# Patient Record
Sex: Male | Born: 1970 | Race: Black or African American | Hispanic: No | Marital: Married | Smoking: Never smoker
Health system: Southern US, Community
[De-identification: ages and names within clinical notes are randomized; demographics above are authoritative.]

## PROBLEM LIST (undated history)

## (undated) DIAGNOSIS — I10 Essential (primary) hypertension: Secondary | ICD-10-CM

---

## 2012-05-19 ENCOUNTER — Ambulatory Visit
Admission: RE | Admit: 2012-05-19 | Discharge: 2012-05-19 | Disposition: A | Discharge status: Home / Self Care | Payer: Managed Care, Other (non HMO) | Source: Ambulatory Visit | Attending: Family Medicine | Admitting: Family Medicine

## 2012-05-19 ENCOUNTER — Other Ambulatory Visit: Payer: Self-pay | Admitting: Family Medicine

## 2012-05-19 DIAGNOSIS — M25551 Pain in right hip: Secondary | ICD-10-CM

## 2013-10-14 IMAGING — CR DG HIP COMPLETE 2+V*R*
2 series · 2 of 2 positions shown · non-contrast
Comparison: None.

CLINICAL DATA: Right hip pain for 1 year, no injury

RIGHT HIP - COMPLETE 2+ VIEW

[t hip ap right]
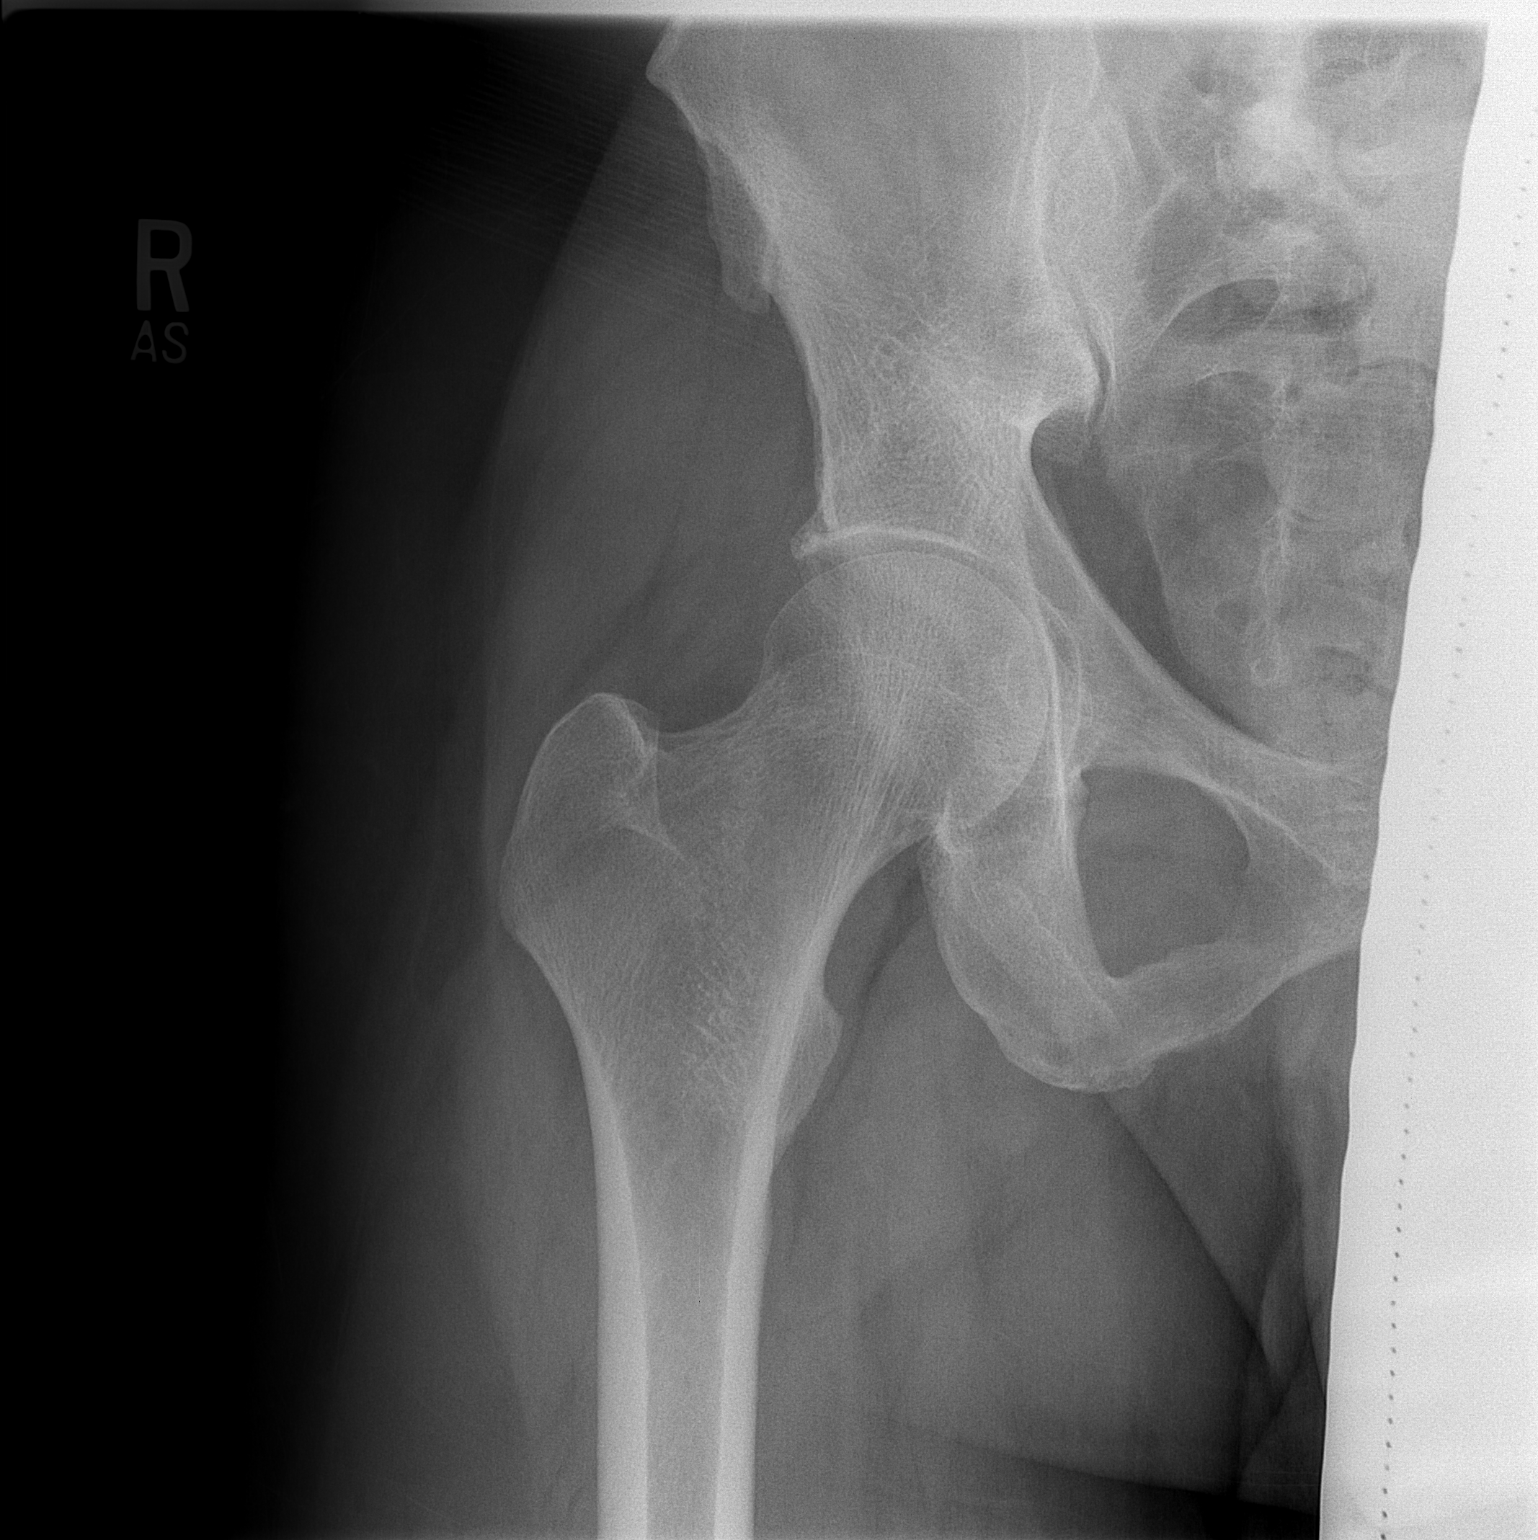

[t hip frog leg right]
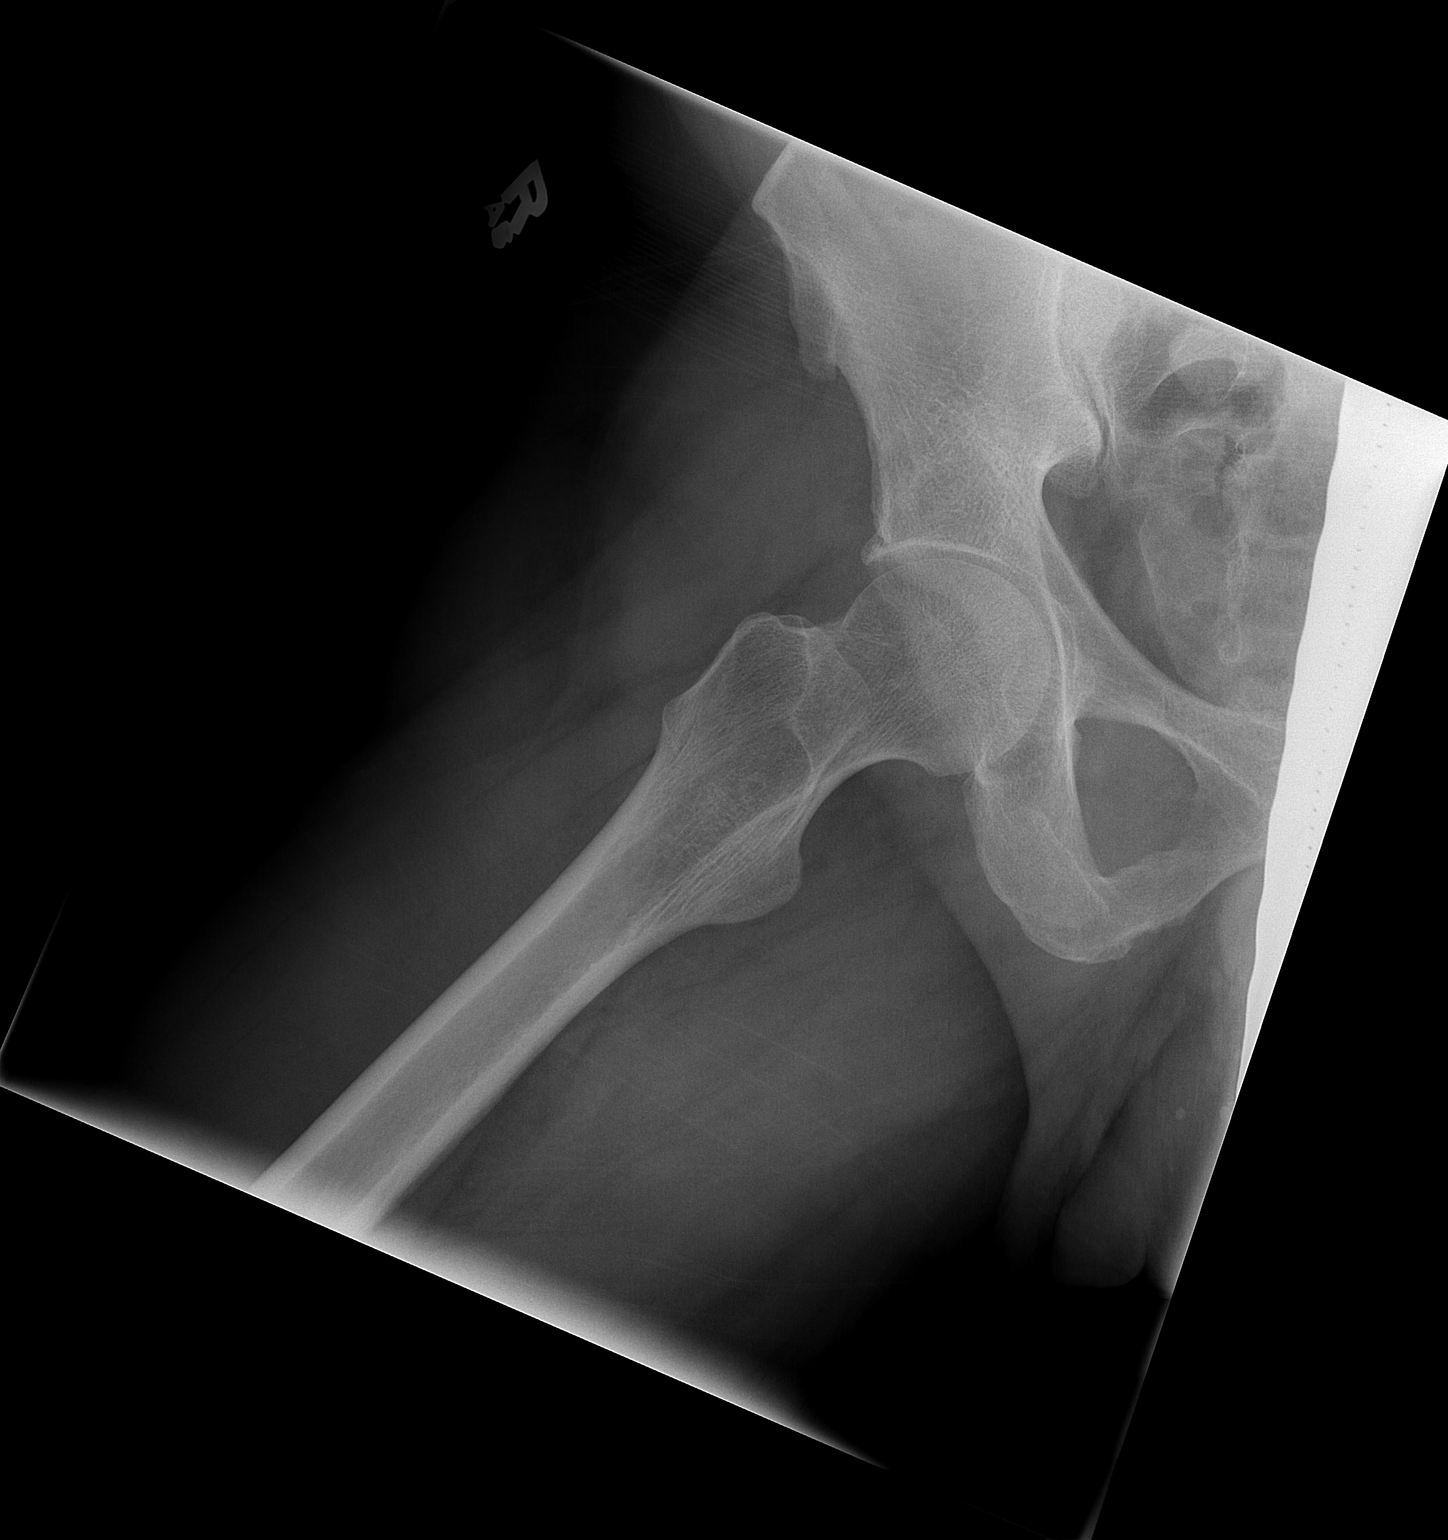

[2 of 2 positions shown; findings below may reference images not displayed]

FINDINGS: There is some loss of right hip joint space but no
significant spur formation or sclerosis is seen.  No acute bony
abnormality is noted.  The right ramus appears intact, and the
right SI joint is corticated.
IMPRESSION: Some loss of right hip joint space.  No significant degenerative
change however.

## 2021-03-23 ENCOUNTER — Encounter
Admission: EM | Disposition: A | Discharge status: Home / Self Care | Payer: Self-pay | Source: Home / Self Care | Attending: Emergency Medicine

## 2021-03-23 ENCOUNTER — Other Ambulatory Visit: Payer: Self-pay

## 2021-03-23 ENCOUNTER — Observation Stay
Admission: EM | Admit: 2021-03-23 | Discharge: 2021-03-24 | Disposition: A | Discharge status: Home / Self Care | Attending: Surgery | Admitting: Surgery

## 2021-03-23 ENCOUNTER — Emergency Department: Admitting: Certified Registered Nurse Anesthetist

## 2021-03-23 ENCOUNTER — Emergency Department

## 2021-03-23 ENCOUNTER — Encounter: Payer: Self-pay | Admitting: Emergency Medicine

## 2021-03-23 DIAGNOSIS — K8012 Calculus of gallbladder with acute and chronic cholecystitis without obstruction: Secondary | ICD-10-CM | POA: Diagnosis not present

## 2021-03-23 DIAGNOSIS — K819 Cholecystitis, unspecified: Secondary | ICD-10-CM | POA: Diagnosis present

## 2021-03-23 DIAGNOSIS — R109 Unspecified abdominal pain: Secondary | ICD-10-CM

## 2021-03-23 DIAGNOSIS — I10 Essential (primary) hypertension: Secondary | ICD-10-CM | POA: Insufficient documentation

## 2021-03-23 DIAGNOSIS — K810 Acute cholecystitis: Secondary | ICD-10-CM

## 2021-03-23 DIAGNOSIS — Z20822 Contact with and (suspected) exposure to covid-19: Secondary | ICD-10-CM | POA: Diagnosis not present

## 2021-03-23 DIAGNOSIS — R1011 Right upper quadrant pain: Secondary | ICD-10-CM | POA: Diagnosis present

## 2021-03-23 HISTORY — DX: Essential (primary) hypertension: I10

## 2021-03-23 LAB — COMPREHENSIVE METABOLIC PANEL
ALT: 38 U/L (ref 0–44)
AST: 28 U/L (ref 15–41)
Albumin: 4.6 g/dL (ref 3.5–5.0)
Alkaline Phosphatase: 59 U/L (ref 38–126)
Anion gap: 15 (ref 5–15)
BUN: 14 mg/dL (ref 6–20)
CO2: 24 mmol/L (ref 22–32)
Calcium: 9.4 mg/dL (ref 8.9–10.3)
Chloride: 100 mmol/L (ref 98–111)
Creatinine, Ser: 1.04 mg/dL (ref 0.61–1.24)
GFR, Estimated: >60 mL/min (ref >60)
Glucose, Bld: 126 mg/dL — ABNORMAL HIGH (ref 70–99)
Potassium: 4.6 mmol/L (ref 3.5–5.1)
Sodium: 139 mmol/L (ref 135–145)
Total Bilirubin: 1 mg/dL (ref 0.3–1.2)
Total Protein: 8.1 g/dL (ref 6.5–8.1)

## 2021-03-23 LAB — CBC
HCT: 48.3 % (ref 39.0–52.0)
Hemoglobin: 15.9 g/dL (ref 13.0–17.0)
MCH: 28.2 pg (ref 26.0–34.0)
MCHC: 32.9 g/dL (ref 30.0–36.0)
MCV: 85.8 fL (ref 80.0–100.0)
Platelets: 341 10*3/uL (ref 150–400)
RBC: 5.63 MIL/uL (ref 4.22–5.81)
RDW: 12.2 % (ref 11.5–15.5)
WBC: 11.7 10*3/uL — ABNORMAL HIGH (ref 4.0–10.5)
nRBC: 0 % (ref 0.0–0.2)

## 2021-03-23 LAB — LIPASE, BLOOD: Lipase: 28 U/L (ref 11–51)

## 2021-03-23 LAB — RESP PANEL BY RT-PCR (FLU A&B, COVID) ARPGX2
Influenza A by PCR: NEGATIVE
Influenza B by PCR: NEGATIVE
SARS Coronavirus 2 by RT PCR: NEGATIVE

## 2021-03-23 SURGERY — CHOLECYSTECTOMY, ROBOT-ASSISTED, LAPAROSCOPIC
Anesthesia: General

## 2021-03-23 MED ORDER — FENTANYL CITRATE (PF) 100 MCG/2ML IJ SOLN
INTRAMUSCULAR | Status: AC
  Filled 2021-03-23: qty 2

## 2021-03-23 MED ORDER — ONDANSETRON HCL 4 MG/2ML IJ SOLN
4.0000 mg | INTRAMUSCULAR | Status: AC
  Administered 2021-03-23: 4 mg via INTRAVENOUS
  Filled 2021-03-23: qty 2

## 2021-03-23 MED ORDER — FENTANYL CITRATE (PF) 100 MCG/2ML IJ SOLN: 25.0000 ug | INTRAMUSCULAR | Status: DC | PRN

## 2021-03-23 MED ORDER — PIPERACILLIN-TAZOBACTAM 3.375 G IVPB: 3.3750 g | Freq: Three times a day (TID) | INTRAVENOUS | Status: DC

## 2021-03-23 MED ORDER — DIPHENHYDRAMINE HCL 12.5 MG/5ML PO ELIX
12.5000 mg | ORAL_SOLUTION | Freq: Four times a day (QID) | ORAL | Status: DC | PRN
  Filled 2021-03-23: qty 5

## 2021-03-23 MED ORDER — MORPHINE SULFATE (PF) 2 MG/ML IV SOLN
2.0000 mg | Freq: Once | INTRAVENOUS | Status: AC
  Administered 2021-03-23: 2 mg via INTRAVENOUS
  Filled 2021-03-23: qty 1

## 2021-03-23 MED ORDER — MIDAZOLAM HCL 2 MG/2ML IJ SOLN
INTRAMUSCULAR | Status: AC
  Filled 2021-03-23: qty 2

## 2021-03-23 MED ORDER — LIDOCAINE HCL (PF) 2 % IJ SOLN
INTRAMUSCULAR | Status: AC
  Filled 2021-03-23: qty 5

## 2021-03-23 MED ORDER — DEXAMETHASONE SODIUM PHOSPHATE 10 MG/ML IJ SOLN
INTRAMUSCULAR | Status: DC | PRN
  Administered 2021-03-23: 10 mg via INTRAVENOUS

## 2021-03-23 MED ORDER — MORPHINE SULFATE (PF) 4 MG/ML IV SOLN
4.0000 mg | Freq: Once | INTRAVENOUS | Status: AC
  Administered 2021-03-23: 4 mg via INTRAVENOUS
  Filled 2021-03-23: qty 1

## 2021-03-23 MED ORDER — SUGAMMADEX SODIUM 200 MG/2ML IV SOLN
INTRAVENOUS | Status: DC | PRN
  Administered 2021-03-23: 200 mg via INTRAVENOUS

## 2021-03-23 MED ORDER — ACETAMINOPHEN 500 MG PO TABS
1000.0000 mg | ORAL_TABLET | Freq: Four times a day (QID) | ORAL | Status: DC
  Administered 2021-03-23 – 2021-03-24 (×2): 1000 mg via ORAL
  Filled 2021-03-23 (×2): qty 2

## 2021-03-23 MED ORDER — SODIUM CHLORIDE 0.9 % IV SOLN: INTRAVENOUS | Status: DC

## 2021-03-23 MED ORDER — ROCURONIUM BROMIDE 100 MG/10ML IV SOLN
INTRAVENOUS | Status: DC | PRN
  Administered 2021-03-23: 20 mg via INTRAVENOUS
  Administered 2021-03-23: 50 mg via INTRAVENOUS
  Administered 2021-03-23: 20 mg via INTRAVENOUS

## 2021-03-23 MED ORDER — DEXMEDETOMIDINE (PRECEDEX) IN NS 20 MCG/5ML (4 MCG/ML) IV SYRINGE
PREFILLED_SYRINGE | INTRAVENOUS | Status: DC | PRN
  Administered 2021-03-23: 4 ug via INTRAVENOUS
  Administered 2021-03-23: 8 ug via INTRAVENOUS
  Administered 2021-03-23 (×2): 4 ug via INTRAVENOUS

## 2021-03-23 MED ORDER — DEXAMETHASONE SODIUM PHOSPHATE 10 MG/ML IJ SOLN
INTRAMUSCULAR | Status: AC
  Filled 2021-03-23: qty 1

## 2021-03-23 MED ORDER — ONDANSETRON HCL 4 MG/2ML IJ SOLN
INTRAMUSCULAR | Status: DC | PRN
Start: 2021-03-23 — End: 2021-03-23
  Administered 2021-03-23: 4 mg via INTRAVENOUS

## 2021-03-23 MED ORDER — PHENYLEPHRINE 40 MCG/ML (10ML) SYRINGE FOR IV PUSH (FOR BLOOD PRESSURE SUPPORT)
PREFILLED_SYRINGE | INTRAVENOUS | Status: DC | PRN
  Administered 2021-03-23 (×2): 160 ug via INTRAVENOUS
  Administered 2021-03-23 (×4): 80 ug via INTRAVENOUS

## 2021-03-23 MED ORDER — ONDANSETRON 4 MG PO TBDP: 4.0000 mg | ORAL_TABLET | Freq: Four times a day (QID) | ORAL | Status: DC | PRN

## 2021-03-23 MED ORDER — GLYCOPYRROLATE 0.2 MG/ML IJ SOLN
INTRAMUSCULAR | Status: DC | PRN
  Administered 2021-03-23 (×2): .2 mg via INTRAVENOUS

## 2021-03-23 MED ORDER — OXYCODONE HCL 5 MG PO TABS: 5.0000 mg | ORAL_TABLET | ORAL | Status: DC | PRN

## 2021-03-23 MED ORDER — ONDANSETRON HCL 4 MG/2ML IJ SOLN: 4.0000 mg | Freq: Four times a day (QID) | INTRAMUSCULAR | Status: DC | PRN

## 2021-03-23 MED ORDER — ENOXAPARIN SODIUM 60 MG/0.6ML IJ SOSY
0.5000 mg/kg | PREFILLED_SYRINGE | INTRAMUSCULAR | Status: DC
  Administered 2021-03-24: 50 mg via SUBCUTANEOUS
  Filled 2021-03-23: qty 0.6

## 2021-03-23 MED ORDER — PHENYLEPHRINE HCL-NACL 20-0.9 MG/250ML-% IV SOLN
INTRAVENOUS | Status: DC | PRN
  Administered 2021-03-23: 20 ug/min via INTRAVENOUS

## 2021-03-23 MED ORDER — SODIUM CHLORIDE 0.9 % IV BOLUS
1000.0000 mL | Freq: Once | INTRAVENOUS | Status: AC
  Administered 2021-03-23: 1000 mL via INTRAVENOUS

## 2021-03-23 MED ORDER — PIPERACILLIN-TAZOBACTAM 3.375 G IVPB
3.3750 g | Freq: Three times a day (TID) | INTRAVENOUS | Status: DC
  Administered 2021-03-23 – 2021-03-24 (×3): 3.375 g via INTRAVENOUS
  Filled 2021-03-23 (×2): qty 50

## 2021-03-23 MED ORDER — INDOCYANINE GREEN 25 MG IV SOLR
5.0000 mg | Freq: Once | INTRAVENOUS | Status: AC
  Administered 2021-03-23: 5 mg via INTRAVENOUS
  Filled 2021-03-23: qty 2

## 2021-03-23 MED ORDER — DIPHENHYDRAMINE HCL 50 MG/ML IJ SOLN: 12.5000 mg | Freq: Four times a day (QID) | INTRAMUSCULAR | Status: DC | PRN

## 2021-03-23 MED ORDER — STERILE WATER FOR IRRIGATION IR SOLN
Status: DC | PRN
  Administered 2021-03-23: 2000 mL

## 2021-03-23 MED ORDER — FENTANYL CITRATE (PF) 100 MCG/2ML IJ SOLN
INTRAMUSCULAR | Status: DC | PRN
Start: 2021-03-23 — End: 2021-03-23
  Administered 2021-03-23 (×4): 50 ug via INTRAVENOUS

## 2021-03-23 MED ORDER — BUPIVACAINE-EPINEPHRINE (PF) 0.25% -1:200000 IJ SOLN
INTRAMUSCULAR | Status: DC | PRN
  Administered 2021-03-23: 30 mL via PERINEURAL

## 2021-03-23 MED ORDER — LIDOCAINE HCL (CARDIAC) PF 100 MG/5ML IV SOSY
PREFILLED_SYRINGE | INTRAVENOUS | Status: DC | PRN
  Administered 2021-03-23: 100 mg via INTRAVENOUS

## 2021-03-23 MED ORDER — MIDAZOLAM HCL 2 MG/2ML IJ SOLN
INTRAMUSCULAR | Status: DC | PRN
  Administered 2021-03-23: 2 mg via INTRAVENOUS

## 2021-03-23 MED ORDER — PROPOFOL 10 MG/ML IV BOLUS
INTRAVENOUS | Status: AC
  Filled 2021-03-23: qty 20

## 2021-03-23 MED ORDER — ACETAMINOPHEN 10 MG/ML IV SOLN
INTRAVENOUS | Status: DC | PRN
  Administered 2021-03-23: 1000 mg via INTRAVENOUS

## 2021-03-23 MED ORDER — KETOROLAC TROMETHAMINE 30 MG/ML IJ SOLN
30.0000 mg | Freq: Four times a day (QID) | INTRAMUSCULAR | Status: DC
  Administered 2021-03-23 – 2021-03-24 (×2): 30 mg via INTRAVENOUS
  Filled 2021-03-23 (×2): qty 1

## 2021-03-23 MED ORDER — PANTOPRAZOLE SODIUM 40 MG IV SOLR
40.0000 mg | Freq: Every day | INTRAVENOUS | Status: DC
  Administered 2021-03-23: 40 mg via INTRAVENOUS
  Filled 2021-03-23: qty 40

## 2021-03-23 MED ORDER — LACTATED RINGERS IV SOLN: INTRAVENOUS | Status: DC | PRN

## 2021-03-23 MED ORDER — KETOROLAC TROMETHAMINE 30 MG/ML IJ SOLN: 30.0000 mg | Freq: Four times a day (QID) | INTRAMUSCULAR | Status: DC | PRN

## 2021-03-23 MED ORDER — ONDANSETRON HCL 4 MG/2ML IJ SOLN
INTRAMUSCULAR | Status: AC
  Filled 2021-03-23: qty 2

## 2021-03-23 MED ORDER — KETOROLAC TROMETHAMINE 30 MG/ML IJ SOLN
INTRAMUSCULAR | Status: AC
  Filled 2021-03-23: qty 1

## 2021-03-23 MED ORDER — PROMETHAZINE HCL 25 MG/ML IJ SOLN: 6.2500 mg | INTRAMUSCULAR | Status: DC | PRN

## 2021-03-23 MED ORDER — BUPIVACAINE LIPOSOME 1.3 % IJ SUSP
INTRAMUSCULAR | Status: DC | PRN
  Administered 2021-03-23: 20 mL

## 2021-03-23 MED ORDER — PROPOFOL 10 MG/ML IV BOLUS
INTRAVENOUS | Status: DC | PRN
  Administered 2021-03-23: 200 mg via INTRAVENOUS

## 2021-03-23 MED ORDER — KETOROLAC TROMETHAMINE 30 MG/ML IJ SOLN
INTRAMUSCULAR | Status: DC | PRN
Start: 2021-03-23 — End: 2021-03-23
  Administered 2021-03-23: 30 mg via INTRAVENOUS

## 2021-03-23 MED ORDER — BUPROPION HCL ER (XL) 150 MG PO TB24
150.0000 mg | ORAL_TABLET | Freq: Every day | ORAL | Status: DC
Start: 2021-03-23 — End: 2021-03-24
  Administered 2021-03-23 – 2021-03-24 (×2): 150 mg via ORAL
  Filled 2021-03-23 (×2): qty 1

## 2021-03-23 MED ORDER — ROCURONIUM BROMIDE 10 MG/ML (PF) SYRINGE
PREFILLED_SYRINGE | INTRAVENOUS | Status: AC
  Filled 2021-03-23: qty 10

## 2021-03-23 MED ORDER — MORPHINE SULFATE (PF) 2 MG/ML IV SOLN: 2.0000 mg | INTRAVENOUS | Status: DC | PRN

## 2021-03-23 MED ORDER — ACETAMINOPHEN 10 MG/ML IV SOLN
INTRAVENOUS | Status: AC
  Filled 2021-03-23: qty 100

## 2021-03-23 SURGICAL SUPPLY — 52 items
BULB RESERV EVAC DRAIN JP 100C (MISCELLANEOUS) ×1 IMPLANT
CANNULA REDUC XI 12-8 STAPL (CANNULA) ×1
CANNULA REDUCER 12-8 DVNC XI (CANNULA) ×2 IMPLANT
CLIP LIGATING HEM O LOK PURPLE (MISCELLANEOUS) ×1 IMPLANT
CLIP LIGATING HEMO O LOK GREEN (MISCELLANEOUS) ×3 IMPLANT
DECANTER SPIKE VIAL GLASS SM (MISCELLANEOUS) ×3 IMPLANT
DERMABOND ADVANCED (GAUZE/BANDAGES/DRESSINGS) ×1
DERMABOND ADVANCED .7 DNX12 (GAUZE/BANDAGES/DRESSINGS) ×2 IMPLANT
DRAIN CHANNEL JP 19F (MISCELLANEOUS) ×1 IMPLANT
DRAPE ARM DVNC X/XI (DISPOSABLE) ×8 IMPLANT
DRAPE COLUMN DVNC XI (DISPOSABLE) ×2 IMPLANT
DRAPE DA VINCI XI ARM (DISPOSABLE) ×4
DRAPE DA VINCI XI COLUMN (DISPOSABLE) ×1
ELECT CAUTERY BLADE 6.4 (BLADE) ×3 IMPLANT
ELECT REM PT RETURN 9FT ADLT (ELECTROSURGICAL) ×3
ELECTRODE REM PT RTRN 9FT ADLT (ELECTROSURGICAL) ×2 IMPLANT
GLOVE SURG ENC MOIS LTX SZ7 (GLOVE) ×6 IMPLANT
GOWN STRL REUS W/ TWL LRG LVL3 (GOWN DISPOSABLE) ×8 IMPLANT
GOWN STRL REUS W/TWL LRG LVL3 (GOWN DISPOSABLE) ×4
IRRIGATION STRYKERFLOW (MISCELLANEOUS) IMPLANT
IRRIGATOR STRYKERFLOW (MISCELLANEOUS) ×3
IV NS 1000ML (IV SOLUTION)
IV NS 1000ML BAXH (IV SOLUTION) IMPLANT
KIT PINK PAD W/HEAD ARE REST (MISCELLANEOUS) ×3 IMPLANT
KIT PINK PAD W/HEAD ARM REST (MISCELLANEOUS) ×2 IMPLANT
LABEL OR SOLS (LABEL) ×3 IMPLANT
MANIFOLD NEPTUNE II (INSTRUMENTS) ×3 IMPLANT
NEEDLE HYPO 22GX1.5 SAFETY (NEEDLE) ×3 IMPLANT
NS IRRIG 500ML POUR BTL (IV SOLUTION) ×3 IMPLANT
OBTURATOR OPTICAL STANDARD 8MM (TROCAR) ×1
OBTURATOR OPTICAL STND 8 DVNC (TROCAR) ×2
OBTURATOR OPTICALSTD 8 DVNC (TROCAR) ×2 IMPLANT
PACK LAP CHOLECYSTECTOMY (MISCELLANEOUS) ×3 IMPLANT
PENCIL ELECTRO HAND CTR (MISCELLANEOUS) ×3 IMPLANT
POUCH SPECIMEN RETRIEVAL 10MM (ENDOMECHANICALS) ×3 IMPLANT
SEAL CANN UNIV 5-8 DVNC XI (MISCELLANEOUS) ×6 IMPLANT
SEAL XI 5MM-8MM UNIVERSAL (MISCELLANEOUS) ×3
SET TUBE SMOKE EVAC HIGH FLOW (TUBING) ×3 IMPLANT
SOLUTION ELECTROLUBE (MISCELLANEOUS) ×3 IMPLANT
SPONGE T-LAP 18X18 ~~LOC~~+RFID (SPONGE) ×3 IMPLANT
SPONGE T-LAP 4X18 ~~LOC~~+RFID (SPONGE) IMPLANT
STAPLER CANNULA SEAL DVNC XI (STAPLE) ×2 IMPLANT
STAPLER CANNULA SEAL XI (STAPLE) ×1
SUT ETHILON 3 0 PS 1 (SUTURE) ×1 IMPLANT
SUT MNCRL AB 4-0 PS2 18 (SUTURE) ×3 IMPLANT
SUT VICRYL 0 AB UR-6 (SUTURE) ×6 IMPLANT
SYR 20ML LL LF (SYRINGE) ×3 IMPLANT
SYR 30ML LL (SYRINGE) ×3 IMPLANT
TAPE TRANSPORE STRL 2 31045 (GAUZE/BANDAGES/DRESSINGS) ×3 IMPLANT
TROCAR BALLN GELPORT 12X130M (ENDOMECHANICALS) ×3 IMPLANT
WATER STERILE IRR 3000ML UROMA (IV SOLUTION) ×1 IMPLANT
WATER STERILE IRR 500ML POUR (IV SOLUTION) ×2 IMPLANT

## 2021-03-23 NOTE — Anesthesia Procedure Notes (Signed)
Procedure Name: Intubation Date/Time: 03/23/2021 4:53 PM Performed by: Lily Peer, Cristabel Bicknell, CRNA Pre-anesthesia Checklist: Patient identified, Emergency Drugs available, Suction available and Patient being monitored Patient Re-evaluated:Patient Re-evaluated prior to induction Oxygen Delivery Method: Circle system utilized Preoxygenation: Pre-oxygenation with 100% oxygen Induction Type: IV induction Ventilation: Mask ventilation without difficulty Laryngoscope Size: McGraph and 4 Grade View: Grade I Tube type: Oral Tube size: 7.5 mm Number of attempts: 1 Airway Equipment and Method: Stylet Placement Confirmation: ETT inserted through vocal cords under direct vision, positive ETCO2 and breath sounds checked- equal and bilateral Secured at: 23 cm Tube secured with: Tape Dental Injury: Teeth and Oropharynx as per pre-operative assessment

## 2021-03-23 NOTE — Transfer of Care (Signed)
Immediate Anesthesia Transfer of Care Note  Patient: Eugene Trevino  Procedure(s) Performed: XI ROBOTIC ASSISTED LAPAROSCOPIC CHOLECYSTECTOMY INDOCYANINE GREEN FLUORESCENCE IMAGING (ICG)  Patient Location: PACU  Anesthesia Type:General  Level of Consciousness: drowsy  Airway & Oxygen Therapy: Patient Spontanous Breathing, Patient connected to face mask oxygen and Patient connected to face mask  Post-op Assessment: Report given to RN  Post vital signs: stable  Last Vitals:  Vitals Value Taken Time  BP    Temp    Pulse 74 03/23/21 1851  Resp    SpO2 100 % 03/23/21 1851  Vitals shown include unvalidated device data.  Last Pain:  Vitals:   03/23/21 1156  TempSrc:   PainSc: 2          Complications: No notable events documented.

## 2021-03-23 NOTE — ED Provider Notes (Signed)
[facility] Provider Note    Event Date/Time   First MD Initiated Contact with Patient 03/23/21 [phone]     (approximate)   History   Abdominal Pain, Nausea, and Diarrhea   HPI  Eugene Trevino is a 51 y.o. male with no reported past medical history except noted in the [place] system hypertension and PTSD  [place] to 3 days ago developed upper abdominal pain.  Has been crampy severe seemingly worsening with frequent vomiting of food and gastric contents.  Denies any black or bloody emesis.  He has had no accompanying diarrhea and does not think he had a bowel movement for few days either.  No previous surgical history  Primarily follows at the [facility]  Reviewed patient's previous office visit with Dr. Brandon Melnick on December 31, 2020 noted to have a history of hypertension depression  No associated chest pain or trouble breathing.  No fevers.  No pain or burning with urination.  Normal urination last urinated last night   Does not use alcohol or smoke  Physical Exam   Triage Vital Signs: ED Triage Vitals  Enc Vitals Group     BP --      Pulse --      Resp --      Temp --      Temp src --      SpO2 --      Weight 03/23/21 0840 220 lb (99.8 kg)     Height 03/23/21 0840 5\' 8"  (1.727 m)     Head Circumference --      Peak Flow --      Pain Score 03/23/21 0839 10     Pain Loc --      Pain Edu? --      Excl. in [place]? --     Most recent vital signs: Vitals:   03/23/21 1300 03/23/21 1430  BP: (!) 170/86 (!) 154/88  Pulse: (!) 51 (!) 53  Resp: 18 20  Temp:    SpO2: 97% 98%     General: Awake, no distress.  Does appear to be in pain.  Does not wish to move seems that movement worsens his abdominal pain.  Sitting quite still and does appear to be in at least moderate pain slightly wincing CV:  Good peripheral perfusion.  Normal heart tones no tachycardia. Resp:  Normal effort.  Normal respirations normal and clear lung sounds with clear  speech Abd:  No distention.  He has focal tenderness mostly in the epigastric and right upper quadrant with positive Murphy sign.  Does exhibit some rebound tenderness in this region as well.  Denies pain over the left upper quadrant and bilateral lower quadrants bilateral.  Slightly distended.  No hernias noted. Other:  No rash, except he has a slight darkening of his skin in his flanks bilaterally which he reports is chronic and is a rash that developed while serving in the [place] and has been seen by previous physicians and diagnosed with form of eczema because of skin darkening   ED Results / Procedures / Treatments   Labs (all labs ordered are listed, but only abnormal results are displayed) Labs Reviewed  CBC - Abnormal; Notable for the following components:      Result Value   WBC 11.7 (*)    All other components within normal limits  COMPREHENSIVE METABOLIC PANEL - Abnormal; Notable for the following components:   Glucose, Bld 126 (*)    All other  components within normal limits  RESP PANEL BY RT-PCR (FLU A&B, COVID) ARPGX2  LIPASE, BLOOD     EKG  ED ECG REPORT I, Delman Kitten, the attending physician, personally viewed and interpreted this ECG.  Date: 03/23/2021 EKG Time: 920 Rate: 60 Rhythm: normal sinus rhythm QRS Axis: normal Intervals: normal ST/T Wave abnormalities: normal Narrative Interpretation: no evidence of acute ischemia    RADIOLOGY US ABDOMEN LIMITED RUQ (LIVER/GB)  Result Date: 03/23/2021 CLINICAL DATA:  Abdominal pain EXAM: ULTRASOUND ABDOMEN LIMITED RIGHT UPPER QUADRANT COMPARISON:  None. FINDINGS: Gallbladder: Numerous echogenic shadowing calculi in the gallbladder. Wall thickening measuring 5 mm with mild increased color Doppler vascularity in the wall. Trace pericholecystic fluid. Negative sonographic Murphy's sign. Common bile duct: Diameter: 6 mm Liver: Diffuse coarse, increased echogenicity of the parenchyma with no focal mass identified.  Portal vein is patent on color Doppler imaging with normal direction of blood flow towards the liver. Other: None. IMPRESSION: 1. Cholelithiasis with mild wall thickening, trace pericholecystic fluid and mildly increased vascularity in the wall. Negative sonographic Murphy's sign reported. Correlate clinically. 2. Abnormal appearance of the liver parenchyma suggesting hepatic steatosis and/or other hepatocellular disease. Electronically Signed   By: Ofilia Neas M.D.   On: 03/23/2021 10:17         PROCEDURES:  Critical Care performed: No  Procedures   MEDICATIONS ORDERED IN ED: Medications  piperacillin-tazobactam (ZOSYN) IVPB 3.375 g (has no administration in time range)  indocyanine green (IC-GREEN) injection 5 mg (has no administration in time range)  sodium chloride 0.9 % bolus 1,000 mL (1,000 mLs Intravenous New Bag/Given 03/23/21 0931)  morphine 4 MG/ML injection 4 mg (4 mg Intravenous Given 03/23/21 0931)  ondansetron (ZOFRAN) injection 4 mg (4 mg Intravenous Given 03/23/21 0931)  morphine 4 MG/ML injection 4 mg (4 mg Intravenous Given 03/23/21 1105)  morphine 2 MG/ML injection 2 mg (2 mg Intravenous Given 03/23/21 1222)     IMPRESSION / MDM / ASSESSMENT AND PLAN / ED COURSE  I reviewed the triage vital signs and the nursing notes.                              Differential diagnosis includes but is not limited to, abdominal perforation, aortic dissection, cholecystitis, appendicitis, diverticulitis, colitis, esophagitis/gastritis, kidney stone, pyelonephritis, urinary tract infection, aortic aneurysm. All are considered in decision and treatment plan. Based upon the patient's presentation and risk factors, we will proceed by obtaining ultrasound right upper quadrant as well as pain control and obtaining laboratory testing.  He does not have any acute cardiopulmonary symptoms.  He has a normal vascular examination of his distal extremities, and also I performed a bedside limited  ultrasound of the aorta for which I do not see any evidence of aneurysmal dilatation or dissection flap.  We will proceed by obtaining right upper quadrant pain control.  Thereafter if right upper quadrant unrevealing may proceed with CT imaging to further evaluate.  He does appear to have evidence of a notable abdominal pain that seems to be mostly centered in the epigastric and right upper quadrant region at this time.  Plan of care discussed with patient and his wife both understanding agreeable with plan and treatment recommendations    ----------------------------------------- 3:40 PM on 03/23/2021 ----------------------------------------- Patient has been seen by general surgery Dr. Dahlia Byes who is admitting to surgical service.   Personally reviewed the patient's imaging and note concern for gallbladder wall edema.  Mild  leukocytosis on my review and interpretation of patient's laboratory testing and also negative COVID test.  Clinical Course as of 03/23/21 1542  Sun Mar 23, 2021  1057 I have placed a consultation with Dr. Dahlia Byes in general surgery.  Updated patient he does report his pain is persistent does still have persistent pain on exam focally in the right upper quadrant epigastric region.  Additional morphine ordered he is fully awake and alert.  Patient is wife understanding of the plan to proceed with a general surgery consultation at this time [MQ]  21 CHL denoes Dr. Dahlia Byes in [place]. Haiku message requesting consult sent (in addition to page), but plan to allow time for Dr. Dahlia Byes to call back given in [place] [MQ]    Clinical Course User Index [MQ] Delman Kitten, MD     FINAL CLINICAL IMPRESSION(S) / ED DIAGNOSES   Final diagnoses:  Abdominal pain  Suspected acute cholecystitis   Rx / DC Orders   ED Discharge Orders     None        Note:  This document was prepared using Dragon voice recognition software and may include unintentional dictation errors.   Delman Kitten,  MD 03/23/21 [phone]

## 2021-03-23 NOTE — Anesthesia Preprocedure Evaluation (Signed)
Anesthesia Evaluation  Patient identified by MRN, date of birth, ID band Patient awake    Reviewed: Allergy & Precautions, H&P , NPO status , Patient's Chart, lab work & pertinent test results, reviewed documented beta blocker date and time   History of Anesthesia Complications Negative for: history of anesthetic complications  Airway Mallampati: I  TM Distance: >3 FB Neck ROM: full    Dental  (+) Caps, Dental Advidsory Given, Missing, Teeth Intact   Pulmonary neg pulmonary ROS,    Pulmonary exam normal breath sounds clear to auscultation       Cardiovascular Exercise Tolerance: Good hypertension, (-) angina(-) Past MI and (-) Cardiac Stents Normal cardiovascular exam(-) dysrhythmias + Valvular Problems/Murmurs  Rhythm:regular Rate:Normal     Neuro/Psych negative neurological ROS  negative psych ROS   GI/Hepatic Neg liver ROS, GERD  ,  Endo/Other  diabetes (borderline)  Renal/GU negative Renal ROS  negative genitourinary   Musculoskeletal   Abdominal   Peds  Hematology negative hematology ROS (+)   Anesthesia Other Findings Past Medical History: No date: Hypertension   Reproductive/Obstetrics negative OB ROS                             Anesthesia Physical Anesthesia Plan  ASA: 2  Anesthesia Plan: General   Post-op Pain Management:    Induction: Intravenous, Rapid sequence and Cricoid pressure planned  PONV Risk Score and Plan: 2 and Ondansetron, Dexamethasone, Midazolam, Treatment may vary due to age or medical condition and Promethazine  Airway Management Planned: Oral ETT  Additional Equipment:   Intra-op Plan:   Post-operative Plan: Extubation in OR  Informed Consent: I have reviewed the patients History and Physical, chart, labs and discussed the procedure including the risks, benefits and alternatives for the proposed anesthesia with the patient or authorized  representative who has indicated his/her understanding and acceptance.     Dental Advisory Given  Plan Discussed with: Anesthesiologist, CRNA and Surgeon  Anesthesia Plan Comments:         Anesthesia Quick Evaluation

## 2021-03-23 NOTE — Progress Notes (Signed)
Anticoagulation monitoring(Lovenox):  51 yo male ordered Lovenox 40 mg Q24h    Filed Weights   03/23/21 0840  Weight: 99.8 kg (220 lb)   BMI 35.23   Lab Results  Component Value Date   CREATININE 1.04 03/23/2021   Estimated Creatinine Clearance: 97.4 mL/min (by C-G formula based on SCr of 1.04 mg/dL). Hemoglobin & Hematocrit     Component Value Date/Time   HGB 15.9 03/23/2021 0914   HCT 48.3 03/23/2021 0914     Per Protocol for Patient with estCrcl > 30 ml/min and BMI > 30, will transition to Lovenox 50 mg Q24h.

## 2021-03-23 NOTE — Op Note (Signed)
Robotic assisted laparoscopic Cholecystectomy  Pre-operative Diagnosis: acute cholecystitis  Post-operative Diagnosis: smae  Procedure:  Robotic assisted laparoscopic Cholecystectomy  Surgeon: Sterling Big, MD FACS  Anesthesia: Gen. with endotracheal tube  Findings: Severe acute Cholecystitis   Estimated Blood Loss: 10cc       Specimens: Gallbladder           Complications: none   Procedure Details  The patient was seen again in the Holding Room. The benefits, complications, treatment options, and expected outcomes were discussed with the patient. The risks of bleeding, infection, recurrence of symptoms, failure to resolve symptoms, bile duct damage, bile duct leak, retained common bile duct stone, bowel injury, any of which could require further surgery and/or ERCP, stent, or papillotomy were reviewed with the patient. The likelihood of improving the patient's symptoms with return to their baseline status is good.  The patient and/or family concurred with the proposed plan, giving informed consent.  The patient was taken to Operating Room, identified  and the procedure verified as Laparoscopic Cholecystectomy.  A Time Out was held and the above information confirmed.  Prior to the induction of general anesthesia, antibiotic prophylaxis was administered. VTE prophylaxis was in place. General endotracheal anesthesia was then administered and tolerated well. After the induction, the abdomen was prepped with Chloraprep and draped in the sterile fashion. The patient was positioned in the supine position.  Cut down technique was used to enter the abdominal cavity and a Hasson trochar was placed after two vicryl stitches were anchored to the fascia. Pneumoperitoneum was then created with CO2 and tolerated well without any adverse changes in the patient's vital signs.  Three 8-mm ports were placed under direct vision. All skin incisions  were infiltrated with a local anesthetic agent before  making the incision and placing the trocars.   The patient was positioned  in reverse Trendelenburg, robot was brought to the surgical field and docked in the standard fashion.  We made sure all the instrumentation was kept indirect view at all times and that there were no collision between the arms. I scrubbed out and went to the console.  The gallbladder was identified, the fundus grasped and retracted cephalad. Severe cholecystitis noted , wall ws so friable that ruptutred upon retraction. We visualized Hydrops.Adhesions were lysed bluntly. The infundibulum was grasped and retracted laterally, exposing the peritoneum overlying the triangle of Calot. This was then divided and exposed in a blunt fashion. An extended critical view of the cystic duct and cystic artery was obtained.  The cystic duct was clearly identified and bluntly dissected.   Artery and duct were double clipped and divided. Using ICG cholangiography we visualize the cystic duct and CBD No evidence of bile injuries. The gallbladder was taken from the gallbladder fossa in a retrograde fashion with the electrocautery.  Hemostasis was achieved with the electrocautery. nspection of the right upper quadrant was performed. No bleeding, bile duct injury or leak, or bowel injury was noted. Robotic instruments and robotic arms were undocked in the standard fashion.  I scrubbed back in.  The gallbladder was removed and placed in an Endocatch bag. I placed 19 blake drain RUQ and irrigated RUQ profusely. No bile or active bleeding noted.  Pneumoperitoneum was released.  The periumbilical port site was closed with interrumpted 0 Vicryl sutures. 4-0 subcuticular Monocryl was used to close the skin. Dermabond was  applied.  The patient was then extubated and brought to the recovery room in stable condition. Sponge, lap, and needle counts  were correct at closure and at the conclusion of the case.               Sterling Big, MD, FACS

## 2021-03-23 NOTE — ED Triage Notes (Signed)
Pt reports abd pain and NVD all night. Pt reports pain is constant and severe.

## 2021-03-23 NOTE — H&P (Signed)
Patient ID: Eugene Trevino, male   DOB: [birth year], 51 y.o.   MRN: [id number]  HPI Eugene Trevino is a 51 y.o. male seen in consultation at the request of Dr.Quale case discussed with him in detail. He presents to the emergency room with acute onset of abdominal pain in the right upper quadrant that presented from last night.  Did have associated nausea and vomiting.  Pain is severe constant and sharp.  Radiates to the back. States that he has been having discomfort for the last couple weeks but the pain started really bad yesterday He does have a history of PTSD in the past.  He is able to perform more than 4 METS of activity without any shortness of breath or chest pain.  He specifically denies fevers chills evidence of jaundice or bili obstruction. Personally reviewed reviewing gallstones, normal common bile duct, thickening of the gallbladder wall and some fluid. CMP was completely normal.  BC mild ovation of white count to 11.7. HPI  Past Medical History:  Diagnosis Date   Hypertension     History reviewed. No pertinent surgical history.  No pertinent family history  Social History Denies any EtOH or recreational drugs    Current Facility-Administered Medications  Medication Dose Route Frequency Provider Last Rate Last Admin   indocyanine green (IC-GREEN) injection 5 mg  5 mg Intravenous Once Desani Sprung F, MD       piperacillin-tazobactam (ZOSYN) IVPB 3.375 g  3.375 g Intravenous Q8H Londa Mackowski F, MD       No current outpatient medications on file.     Review of Systems Full ROS  was asked and was negative except for the information on the HPI  Physical Exam Blood pressure (!) 154/88, pulse (!) 53, temperature 98.2 F (36.8 C), temperature source Oral, resp. rate 20, height 5\' 8"  (1.727 m), weight 99.8 kg, SpO2 98 %. CONSTITUTIONAL: NAD. EYES: Pupils are equal, round,  Sclera are non-icteric. EARS, NOSE, MOUTH AND THROAT: He is wearing a mask Hearing is intact to  voice. LYMPH NODES:  Lymph nodes in the neck are normal. RESPIRATORY:  Lungs are clear. There is normal respiratory effort, with equal breath sounds bilaterally, and without pathologic use of accessory muscles. CARDIOVASCULAR: Heart is regular without murmurs, gallops, or rubs. GI: The abdomen is  soft, TTP RUQ w + Murphy sign a no peritonitis. There are no palpable masses. There is no hepatosplenomegaly. There are normal bowel GU: Rectal deferred.   MUSCULOSKELETAL: Normal muscle strength and tone. No cyanosis or edema.   SKIN: Turgor is good and there are no pathologic skin lesions or ulcers. NEUROLOGIC: Motor and sensation is grossly normal. Cranial nerves are grossly intact. PSYCH:  Oriented to person, place and time. Affect is normal.  Data Reviewed  I have personally reviewed the patient's imaging, laboratory findings and medical records.    Assessment/Plan 51 year old male presents with classic signs and symptoms of acute cholecystitis.  Discussed with the patient in detail.  We will start IV antibiotics IV fluids and perform cholecystectomy later today pending or availability. I discussed the procedure in detail.  The patient was given 44.  We discussed the risks and benefits of a laparoscopic cholecystectomy and possible cholangiogram including, but not limited to bleeding, infection, injury to surrounding structures such as the intestine or liver, bile leak, retained gallstones, need to convert to an open procedure, prolonged diarrhea, blood clots such as  DVT, common bile duct injury, anesthesia risks, and possible need for additional  procedures.  The likelihood of improvement in symptoms and return to the patient's normal status is good. We discussed the typical post-operative recovery course.   Sterling Big, MD FACS General Surgeon 03/23/2021, 3:16 PM

## 2021-03-24 LAB — BASIC METABOLIC PANEL
Anion gap: 8 (ref 5–15)
BUN: 14 mg/dL (ref 6–20)
CO2: 26 mmol/L (ref 22–32)
Calcium: 9 mg/dL (ref 8.9–10.3)
Chloride: 104 mmol/L (ref 98–111)
Creatinine, Ser: 1.18 mg/dL (ref 0.61–1.24)
GFR, Estimated: >60 mL/min (ref >60)
Glucose, Bld: 115 mg/dL — ABNORMAL HIGH (ref 70–99)
Potassium: 4.3 mmol/L (ref 3.5–5.1)
Sodium: 138 mmol/L (ref 135–145)

## 2021-03-24 LAB — CBC
HCT: 44.8 % (ref 39.0–52.0)
Hemoglobin: 15 g/dL (ref 13.0–17.0)
MCH: 28 pg (ref 26.0–34.0)
MCHC: 33.5 g/dL (ref 30.0–36.0)
MCV: 83.7 fL (ref 80.0–100.0)
Platelets: 337 10*3/uL (ref 150–400)
RBC: 5.35 MIL/uL (ref 4.22–5.81)
RDW: 12.6 % (ref 11.5–15.5)
WBC: 11.9 10*3/uL — ABNORMAL HIGH (ref 4.0–10.5)
nRBC: 0 % (ref 0.0–0.2)

## 2021-03-24 LAB — PHOSPHORUS: Phosphorus: 3.5 mg/dL (ref 2.5–4.6)

## 2021-03-24 LAB — HIV ANTIBODY (ROUTINE TESTING W REFLEX): HIV Screen 4th Generation wRfx: NONREACTIVE

## 2021-03-24 LAB — MAGNESIUM: Magnesium: 2 mg/dL (ref 1.7–2.4)

## 2021-03-24 MED ORDER — OXYCODONE HCL 5 MG PO TABS: 5.0000 mg | ORAL_TABLET | Freq: Four times a day (QID) | ORAL | 0 refills | Status: AC | PRN

## 2021-03-24 MED ORDER — IBUPROFEN 600 MG PO TABS: 600.0000 mg | ORAL_TABLET | Freq: Four times a day (QID) | ORAL | 0 refills | Status: AC | PRN

## 2021-03-24 MED ORDER — AMOXICILLIN-POT CLAVULANATE 875-125 MG PO TABS: 1.0000 | ORAL_TABLET | Freq: Two times a day (BID) | ORAL | 0 refills | Status: DC

## 2021-03-24 NOTE — Discharge Summary (Signed)
[facility] SURGICAL DISCHARGE SUMMARY  Patient ID: Eugene Trevino MRN: [id number] DOB/AGE: November 14, 1970 51 y.o.  Admit date: 03/23/2021 Discharge date: 03/24/2021  Discharge Diagnoses Patient Active Problem List   Diagnosis Date Noted   Cholecystitis 03/23/2021    Consultants None  Procedures 03/23/2021:  Robotic assisted laparoscopic cholecystectomy  HPI: Eugene Trevino is a 51 y.o. male seen in consultation at the request of Dr.Quale case discussed with him in detail. He presents to the emergency room with acute onset of abdominal pain in the right upper quadrant that presented from last night.  Did have associated nausea and vomiting.  Pain is severe constant and sharp.  Radiates to the back. States that he has been having discomfort for the last couple weeks but the pain started really bad yesterday He does have a history of PTSD in the past. He is able to perform more than 4 METS of activity without any shortness of breath or chest pain.  He specifically denies fevers chills evidence of jaundice or bili obstruction. Personally reviewed reviewing gallstones, normal common bile duct, thickening of the gallbladder wall and some fluid. CMP was completely normal. BC mild ovation of white count to 11.7.  Hospital Course: Informed consent was obtained and documented, and patient underwent uneventful robotic assisted laparoscopic cholecystectomy (Dr Everlene Farrier, 03/23/2021).  Post-operatively, patient's pain/symptoms improved/resolved and advancement of patient's diet and ambulation were well-tolerated. The remainder of patient's hospital course was essentially unremarkable, and discharge planning was initiated accordingly with patient safely able to be discharged home with appropriate discharge instructions, antibiotics (Augmentin x7 days), pain control, and outpatient follow-up after all of his questions were answered to his expressed satisfaction.   Discharge Condition:  Good   Physical Examination:  Constitutional: Well appearing male, NAD Pulmonary: Normal effort, no respiratory distress Gastrointestinal: Soft, incisional soreness, non-distended, no rebound/guarding. Surgical drain in place; serosanguinous Skin: Laparoscopic incisions are CDI with dermabond, no erythema    Allergies as of 03/24/2021   No Known Allergies      Medication List     TAKE these medications    amoxicillin-clavulanate 875-125 MG tablet Commonly known as: Augmentin Take 1 tablet by mouth 2 (two) times daily for 7 days.   bismuth subsalicylate 262 MG/15ML suspension Commonly known as: PEPTO BISMOL Take 30 mLs by mouth every 4 (four) hours as needed for diarrhea or loose stools.   buPROPion 150 MG 24 hr tablet Commonly known as: WELLBUTRIN XL Take 150 mg by mouth daily.   calcium carbonate 500 MG chewable tablet Commonly known as: TUMS - dosed in mg elemental calcium Chew 1 tablet by mouth daily.   ibuprofen 600 MG tablet Commonly known as: ADVIL Take 1 tablet (600 mg total) by mouth every 6 (six) hours as needed.   oxyCODONE 5 MG immediate release tablet Commonly known as: Oxy IR/ROXICODONE Take 1 tablet (5 mg total) by mouth every 6 (six) hours as needed for severe pain or breakthrough pain.   prazosin 2 MG capsule Commonly known as: MINIPRESS Take 1 capsule by mouth at bedtime.          Follow-up Information     Pabon, [place] F, MD. Schedule an appointment as soon as possible for a visit in 1 week(s).   Specialty: General Surgery Why: s/p lap chole, has drain Contact information: [address] [place] [address] [phone]                  Time spent on discharge management including  discussion of hospital course, clinical condition, outpatient instructions, prescriptions, and follow up with the patient and members of the medical team: >30 minutes  -- Lynden Oxford , PA-C [facility]   03/24/2021, 9:16 AM [phone] M-F: 7am - 4pm

## 2021-03-24 NOTE — Anesthesia Postprocedure Evaluation (Signed)
Anesthesia Post Note  Patient: Eugene Trevino  Procedure(s) Performed: XI ROBOTIC ASSISTED LAPAROSCOPIC CHOLECYSTECTOMY INDOCYANINE GREEN FLUORESCENCE IMAGING (ICG)  Patient location during evaluation: PACU Anesthesia Type: General Level of consciousness: awake and alert Pain management: pain level controlled Vital Signs Assessment: post-procedure vital signs reviewed and stable Respiratory status: spontaneous breathing, nonlabored ventilation, respiratory function stable and patient connected to nasal cannula oxygen Cardiovascular status: blood pressure returned to baseline and stable Postop Assessment: no apparent nausea or vomiting Anesthetic complications: no   No notable events documented.   Last Vitals:  Vitals:   03/24/21 0519 03/24/21 0719  BP: (!) 128/58 113/68  Pulse: 70 (!) 52  Resp: 18 18  Temp: 37.1 C 37.1 C  SpO2: 99% 96%    Last Pain:  Vitals:   03/24/21 0750  TempSrc:   PainSc: 0-No pain                 Lenard Simmer

## 2021-03-24 NOTE — Progress Notes (Signed)
Eugene Trevino to be D/C'd Home per MD order.  Discussed with the patient and all questions fully answered.  VSS, Skin clean, dry and intact without evidence of skin break down, no evidence of skin tears noted. IV catheter discontinued intact. Site without signs and symptoms of complications. Dressing and pressure applied.  An After Visit Summary was printed and given to the patient. Patient prescriptions sent to pharmacy.  D/c education completed with patient/family including follow up instructions, medication list, d/c activities limitations if indicated, with other d/c instructions as indicated by MD - patient able to verbalize understanding, all questions fully answered.   Patient instructed to return to ED, call 911, or call MD for any changes in condition.   Patient escorted via [place], and D/C home via private auto.  [name] 03/24/2021 12:29 PM

## 2021-03-24 NOTE — TOC Initial Note (Signed)
Transition of Care [facility]) - Initial/Assessment Note    Patient Details  Name: Eugene Trevino MRN: [id number] Date of Birth: 1970/09/12  [facility]) CM/SW Contact:    Chapman Fitch, RN Phone Number: 03/24/2021, 9:43 AM  Clinical Narrative:                   Transition of Care (TOC) Screening Note   Patient Details  Name: Eugene Trevino Date of Birth: 06-12-1970   Transition of Care (TOC) CM/SW Contact:    Chapman Fitch, RN Phone Number: 03/24/2021, 9:43 AM    Transition of Care Department [facility]) has reviewed patient and no TOC needs have been identified at this time. We will continue to monitor patient advancement through interdisciplinary progression rounds. If new patient transition needs arise, please place a TOC consult.         Patient Goals and CMS Choice        Expected Discharge Plan and Services           Expected Discharge Date: 03/24/21                                    Prior Living Arrangements/Services                       Activities of Daily Living Home Assistive Devices/Equipment: Eyeglasses ADL Screening (condition at time of admission) Patient's cognitive ability adequate to safely complete daily activities?: Yes Is the patient deaf or have difficulty hearing?: No Does the patient have difficulty seeing, even when wearing glasses/contacts?: No Does the patient have difficulty concentrating, remembering, or making decisions?: No Patient able to express need for assistance with ADLs?: Yes Does the patient have difficulty dressing or bathing?: No Independently performs ADLs?: Yes (appropriate for developmental age) Does the patient have difficulty walking or climbing stairs?: No Weakness of Legs: None Weakness of Arms/Hands: None  Permission Sought/Granted                  Emotional Assessment              Admission diagnosis:  Abdominal pain [R10.9] Cholecystitis [K81.9] Patient  Active Problem List   Diagnosis Date Noted   Cholecystitis 03/23/2021   PCP:  Pcp, No Pharmacy:   [id number] Judithann Sheen, [place] - [address] [place] [address] Phone: [phone] Fax: [phone]     Social Determinants of Health (SDOH) Interventions    Readmission Risk Interventions No flowsheet data found.

## 2021-03-24 NOTE — Discharge Instructions (Signed)
In addition to included general post-operative instruction,  Diet: Resume home diet. Recommend avoiding or limiting fatty/greasy foods over the next few days/week. If you do eat these, you may (or may not) notice diarrhea. This is expected while your body adjusts to not having a gallbladder, and it typically resolves with time.    Activity: No heavy lifting >20 pounds (children, pets, laundry, garbage) or strenuous activity for 4 weeks, but light activity and walking are encouraged. Do not drive or drink alcohol if taking narcotic pain medications or having pain that might distract from driving.  Wound care: If you can keep drain site water proofed, 2 days after surgery (01/24), you may shower/get incision wet with soapy water and pat dry (do not rub incisions), but no baths or submerging incision underwater until follow-up.   Medications: Resume all home medications. For mild to moderate pain: acetaminophen (Tylenol) or ibuprofen/naproxen (if no kidney disease). Combining Tylenol with alcohol can substantially increase your risk of causing liver disease. Narcotic pain medications, if prescribed, can be used for severe pain, though may cause nausea, constipation, and drowsiness. Do not combine Tylenol and Percocet (or similar) within a 6 hour period as Percocet (and similar) contain(s) Tylenol. If you do not need the narcotic pain medication, you do not need to fill the prescription.  Call office [phone] / [phone]) at any time if any questions, worsening pain, fevers/chills, bleeding, drainage from incision site, or other concerns.

## 2021-03-25 LAB — SURGICAL PATHOLOGY

## 2021-03-31 ENCOUNTER — Encounter: Payer: Self-pay | Admitting: Surgery

## 2021-03-31 ENCOUNTER — Ambulatory Visit (INDEPENDENT_AMBULATORY_CARE_PROVIDER_SITE_OTHER): Admitting: Surgery

## 2021-03-31 ENCOUNTER — Other Ambulatory Visit: Payer: Self-pay

## 2021-03-31 VITALS — BP 123/81 | HR 50 | Temp 98.3°F | Ht 68.0 in | Wt 219.2 lb

## 2021-03-31 DIAGNOSIS — Z0289 Encounter for other administrative examinations: Secondary | ICD-10-CM | POA: Insufficient documentation

## 2021-03-31 DIAGNOSIS — Z09 Encounter for follow-up examination after completed treatment for conditions other than malignant neoplasm: Secondary | ICD-10-CM

## 2021-03-31 DIAGNOSIS — Z7729 Contact with and (suspected ) exposure to other hazardous substances: Secondary | ICD-10-CM | POA: Insufficient documentation

## 2021-03-31 DIAGNOSIS — K810 Acute cholecystitis: Secondary | ICD-10-CM

## 2021-03-31 DIAGNOSIS — K036 Deposits [accretions] on teeth: Secondary | ICD-10-CM | POA: Insufficient documentation

## 2021-03-31 DIAGNOSIS — M109 Gout, unspecified: Secondary | ICD-10-CM | POA: Insufficient documentation

## 2021-03-31 NOTE — Patient Instructions (Addendum)
WE removed the drain today. Please keep a dry dressing over the area until healed completely.    GENERAL POST-OPERATIVE PATIENT INSTRUCTIONS   WOUND CARE INSTRUCTIONS:  Keep a dry clean dressing on the wound if there is drainage. The initial bandage may be removed after 24 hours.  Once the wound has quit draining you may leave it open to air.  If clothing rubs against the wound or causes irritation and the wound is not draining you may cover it with a dry dressing during the daytime.  Try to keep the wound dry and avoid ointments on the wound unless directed to do so.  If the wound becomes bright red and painful or starts to drain infected material that is not clear, please contact your physician immediately.  If the wound is mildly pink and has a thick firm ridge underneath it, this is normal, and is referred to as a healing ridge.  This will resolve over the next 4-6 weeks.  BATHING: You may shower if you have been informed of this by your surgeon. However, Please do not submerge in a tub, hot tub, or pool until incisions are completely sealed or have been told by your surgeon that you may do so.  DIET:  You may eat any foods that you can tolerate.  It is a good idea to eat a high fiber diet and take in plenty of fluids to prevent constipation.  If you do become constipated you may want to take a mild laxative or take ducolax tablets on a daily basis until your bowel habits are regular.  Constipation can be very uncomfortable, along with straining, after recent surgery.  ACTIVITY:  You are encouraged to cough and deep breath or use your incentive spirometer if you were given one, every 15-30 minutes when awake.  This will help prevent respiratory complications and low grade fevers post-operatively if you had a general anesthetic.  You may want to hug a pillow when coughing and sneezing to add additional support to the surgical area, if you had abdominal or chest surgery, which will decrease pain  during these times.  You are encouraged to walk and engage in light activity for the next two weeks.  You should not lift more than 20 pounds, until 05/04/2021 as it could put you at increased risk for complications.  Twenty pounds is roughly equivalent to a plastic bag of groceries. At that time- Listen to your body when lifting, if you have pain when lifting, stop and then try again in a few days. Soreness after doing exercises or activities of daily living is normal as you get back in to your normal routine.  MEDICATIONS:  Try to take narcotic medications and anti-inflammatory medications, such as tylenol, ibuprofen, naprosyn, etc., with food.  This will minimize stomach upset from the medication.  Should you develop nausea and vomiting from the pain medication, or develop a rash, please discontinue the medication and contact your physician.  You should not drive, make important decisions, or operate machinery when taking narcotic pain medication.  SUNBLOCK Use sun block to incision area over the next year if this area will be exposed to sun. This helps decrease scarring and will allow you avoid a permanent darkened area over your incision.  QUESTIONS:  Please feel free to call our office if you have any questions, and we will be glad to assist you. [phone]

## 2021-04-02 ENCOUNTER — Encounter: Payer: Self-pay | Admitting: Surgery

## 2021-04-02 NOTE — Progress Notes (Signed)
Julies is following after robotic cholecystectomy.  He is doing well.  No fevers no chills. Complete antibiotic therapy. Pathology discussed with him.  Drain with minimal output.  PE NAD Abd: Soft nontender incisions healing well.  Drain with scant serous fluid it was removed at bedside.  A/P doing very well without complications
